# Patient Record
Sex: Male | Born: 1975 | Race: White | Hispanic: No | Marital: Married | Smoking: Never smoker
Health system: Southern US, Community
[De-identification: ages and names within clinical notes are randomized; demographics above are authoritative.]

## PROBLEM LIST (undated history)

## (undated) DIAGNOSIS — R7989 Other specified abnormal findings of blood chemistry: Secondary | ICD-10-CM

## (undated) DIAGNOSIS — K859 Acute pancreatitis without necrosis or infection, unspecified: Secondary | ICD-10-CM

## (undated) DIAGNOSIS — N320 Bladder-neck obstruction: Secondary | ICD-10-CM

## (undated) DIAGNOSIS — R319 Hematuria, unspecified: Secondary | ICD-10-CM

## (undated) HISTORY — PX: FEMUR FRACTURE SURGERY: SHX633

## (undated) HISTORY — PX: FRACTURE SURGERY: SHX138

## (undated) HISTORY — PX: NO PAST SURGERIES: SHX2092

---

## 2013-08-12 ENCOUNTER — Ambulatory Visit: Payer: Self-pay | Admitting: Emergency Medicine

## 2013-08-19 ENCOUNTER — Ambulatory Visit: Payer: Self-pay | Admitting: Family Medicine

## 2013-08-26 ENCOUNTER — Ambulatory Visit: Payer: Self-pay | Admitting: Emergency Medicine

## 2014-10-18 IMAGING — CR DG LUMBAR SPINE COMPLETE 4+V
1 series · 5 of 5 positions shown · non-contrast
Comparison: none

REASON FOR EXAM: Twisted back in near fall. Now with right radiculopathy
COMMENTS:

PROCEDURE:     MDR - M[REDACTED] SPINE WITH OBLIQUES  - August 12, 2013  [DATE]
RESULT:     Lumbar spine images show normal alignment. There is no fracture
or bony destruction. No severe degenerative changes are seen.

[Series 1: ap · 0.17mm/px · 5 of 5 slices shown]
[im 1/5]
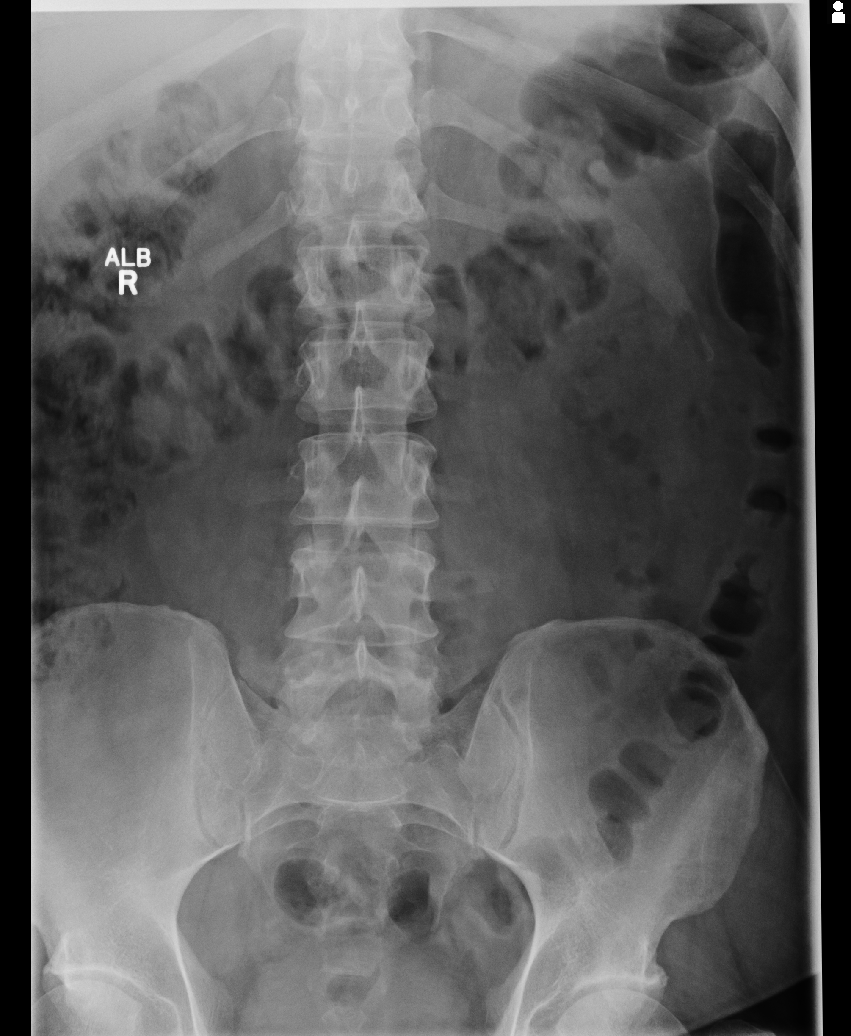
[im 2/5]
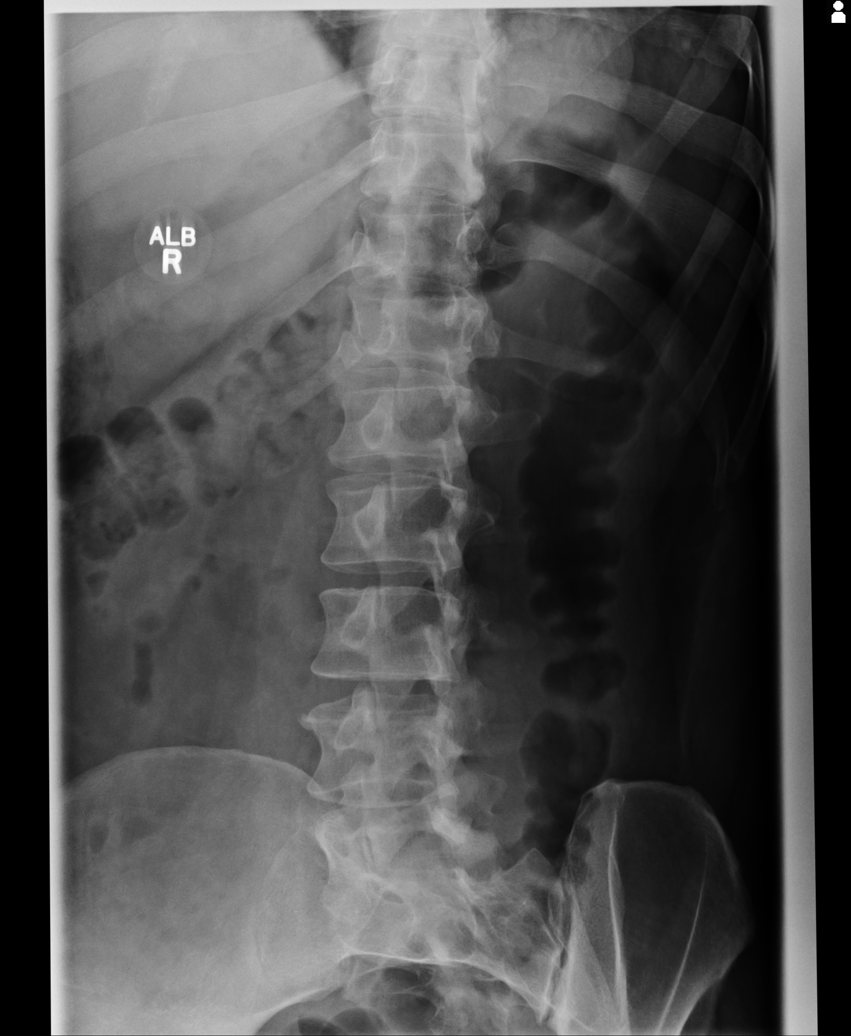
[im 3/5]
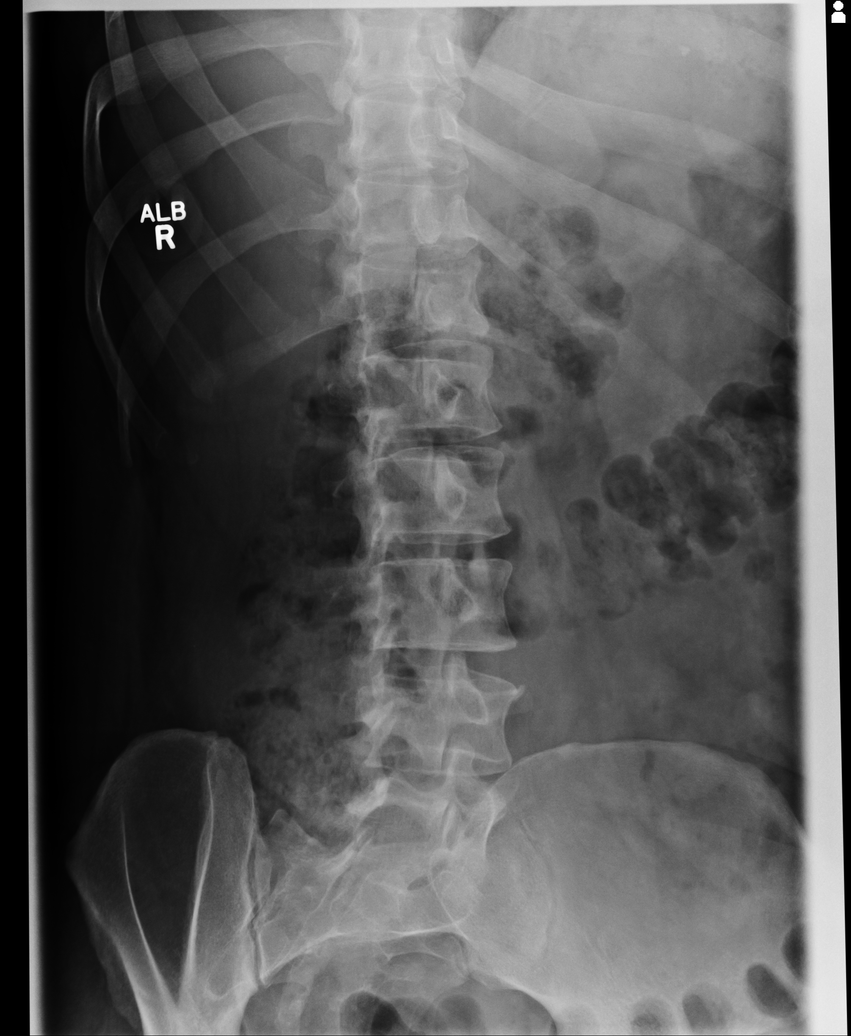
[im 4/5]
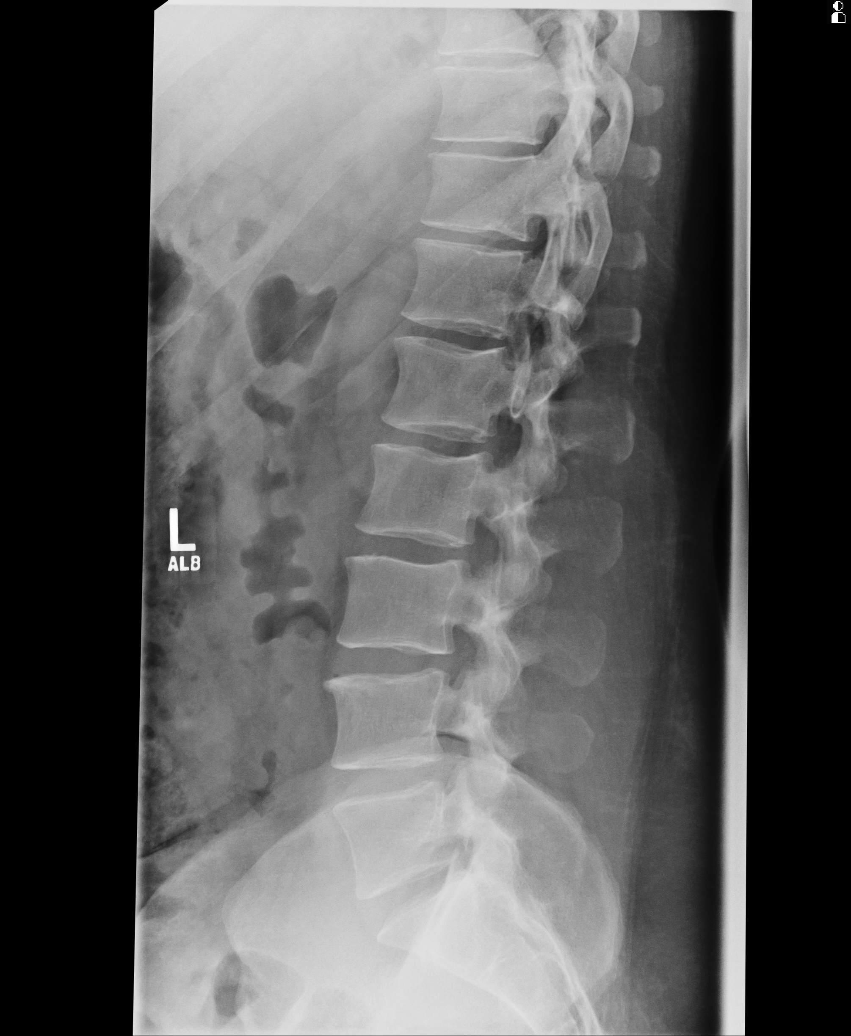
[im 5/5]
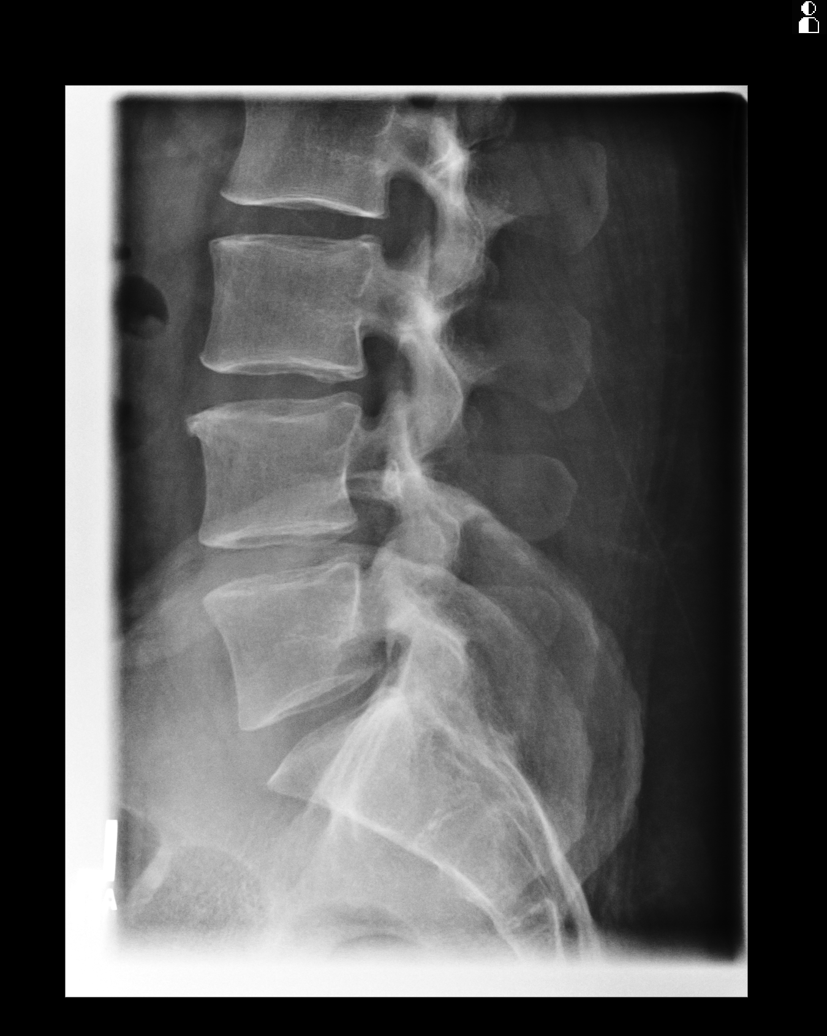

[5 of 5 positions shown; findings below may reference images not displayed]

IMPRESSION: No significant degenerative change or acute bony
abnormality.

[REDACTED]

## 2016-05-18 ENCOUNTER — Ambulatory Visit
Admission: EM | Admit: 2016-05-18 | Discharge: 2016-05-18 | Disposition: A | Discharge status: Home / Self Care | Payer: BLUE CROSS/BLUE SHIELD | Attending: Family Medicine | Admitting: Family Medicine

## 2016-05-18 DIAGNOSIS — N644 Mastodynia: Secondary | ICD-10-CM

## 2016-05-18 DIAGNOSIS — E291 Testicular hypofunction: Secondary | ICD-10-CM

## 2016-05-18 DIAGNOSIS — E349 Endocrine disorder, unspecified: Secondary | ICD-10-CM

## 2016-05-18 DIAGNOSIS — R066 Hiccough: Secondary | ICD-10-CM

## 2016-05-18 MED ORDER — CEFUROXIME AXETIL 500 MG PO TABS: 500.0000 mg | ORAL_TABLET | Freq: Two times a day (BID) | ORAL | Status: DC

## 2016-05-18 MED ORDER — METOCLOPRAMIDE HCL 10 MG PO TABS: 10.0000 mg | ORAL_TABLET | Freq: Three times a day (TID) | ORAL | Status: DC | PRN

## 2016-05-18 NOTE — ED Provider Notes (Addendum)
CSN: [id number]     Arrival date & time 05/18/16  1632 History   First MD Initiated Contact with Patient 05/18/16 1711    .Nurses notes were reviewed. Chief Complaint  Patient presents with  . Hiccups  . Breast Problem   Individuals here for multiple problems  #1 right nipple pain. Patient states that his right nipple is been sore and tender for about almost 2 months now. He cannot pinpoint trauma or injury to the right nipple states that his right nipple has been sore and tender. Now when anything touches his right nipple is very painful and causes some discomfort. There is no history of breast cancer or some breast lesions in the family. He's never had this problem before. He denies any type of discharge or other secretions from his nipples.    #2 hiccups. He states since Monday he's had this recurrent hiccups. He reports hearing something funny positions swallowing some water since then he's not been unable to shake this hiccups. States hiccups continue to recur.    #3 he reports as I was leaving the room another issue. He states that he's had slow flow for number of years he's always had trouble urinating for over 20 years. He states that the PCP he saw several years ago did a prostate exam after his employment physical which was not too thrilled about and felt that his prostate was somewhat enlarged. He wanted up getting a CT scan of his head after having of testosterone levels drawn and found his testosterone levels were extremely low. Palate they were worried about pituitary adenoma but later felt that the pituitary was satisfactory and was placed on testosterone. He is quite elaborate history of how in trying to pregnant his wife the last 2 children he had to go off the Flomax and the testosterone that he was placed on. At this point time he feels he needs to have further evaluation and to be revisited as far as the issues concerning. This issue is really a non-issue for the urgent care and  I strongly suggest that he see a PCP to have his testosterone levels drawn and have his pituitary gland evaluated.  No past pertinent medical history other than mentioned above. No previous surgeries mother had multiple strokes before she died. No history of breast cancer family. No known drug allergies and he never smoked   (Consider location/radiation/quality/duration/timing/severity/associated sxs/prior Treatment) HPI  History reviewed. No pertinent past medical history. Past Surgical History  Procedure Laterality Date  . No past surgeries     Family History  Problem Relation Age of Onset  . CVA Mother   . Hypertension Mother    Social History  Substance Use Topics  . Smoking status: Never Smoker   . Smokeless tobacco: None  . Alcohol Use: No    Review of Systems  Allergies  Review of patient's allergies indicates no known allergies.  Home Medications   Prior to Admission medications   Medication Sig Start Date End Date Taking? Authorizing Provider  cefUROXime (CEFTIN) 500 MG tablet Take 1 tablet (500 mg total) by mouth 2 (two) times daily. 05/18/16   Hassan RowanEugene Phuc Kluttz, MD  metoCLOPramide (REGLAN) 10 MG tablet Take 1 tablet (10 mg total) by mouth every 8 (eight) hours as needed (hiccups). 05/18/16   Hassan RowanEugene Triston Skare, MD   Meds Ordered and Administered this Visit  Medications - No data to display  BP 136/84 mmHg  Pulse 82  Temp(Src) 97.7 F (36.5 C) (Tympanic)  Resp  16  Ht 5\' 10"  (1.778 m)  Wt 270 lb (122.471 kg)  BMI 38.74 kg/m2  SpO2 99% No data found.   Physical Exam  Constitutional: He is oriented to person, place, and time. He appears well-developed and well-nourished.  Non-toxic appearance. He does not have a sickly appearance. He does not appear ill.  Obese white male  HENT:  Head: Normocephalic and atraumatic.  Right Ear: Hearing normal.  Left Ear: Hearing normal.  Nose: Nose normal.  Mouth/Throat: Oropharynx is clear and moist. He does not have dentures.  Normal dentition. No oropharyngeal exudate.  Eyes: Conjunctivae are normal. Pupils are equal, round, and reactive to light.  Neck: Normal range of motion.  Pulmonary/Chest: Effort normal and breath sounds normal. No respiratory distress. He has no wheezes.  There is tenderness over the right nipple no masses could be palpated no discharge could be elicited from either nipples. No changes between the 2 aereolas were noted  Musculoskeletal: Normal range of motion.  Neurological: He is alert and oriented to person, place, and time.  Skin: Skin is warm.  Psychiatric: He has a normal mood and affect.  Vitals reviewed.   ED Course  Procedures (including critical care time)  Labs Review Labs Reviewed - No data to display  Imaging Review No results found.   Visual Acuity Review  Right Eye Distance:   Left Eye Distance:   Bilateral Distance:    Right Eye Near:   Left Eye Near:    Bilateral Near:         MDM   1. Intractable hiccups   2. Nipple pain   3. Testosterone deficiency    #1 We will place him on for the intractable hiccups Reglan 10 mg 1 tablet 3 times a day enough for 10 days if this doesn't work he'll need to see possibly ENT. And have further evaluation.   #2 nipple pain. Stress to him we'll place him on Ceftin antibiotic cases of some infection mastitis the nipple but am recommending he follow up with a surgeon in 2 weeks I've been upfront with him and let him know them worried that they can get breast cancer and that he needs a surgeon to evaluate him and possibly order a mammogram if he has any tenderness whatsoever in his right nipple. This is something that should be followed up and not ignored. Will give them the phone number of the surgical group upstairs since the office is closed now for him to make an appointment.      #3 low testosterone level. As stated above I stressed to him this is not an urgent care issue that I can do with all would deal with. He  needs to establish himself with a PCP and be evaluated for history of low testosterone and difficulty he had an challenges he had an impregnating his partner.       Hassan RowanEugene Eloyce Bultman, MD 05/18/16 1811  Hassan RowanEugene Praneel Haisley, MD 05/22/16 2110

## 2016-05-18 NOTE — ED Notes (Addendum)
Patient complains of hiccups for 3 days. Patient states that he has been having them intermittently. Patient states that he is also having nipple pain that has been constant for 2 months, worse with certain movements. Patient states that his entire body feels like it is bruised from hiccupping so much.

## 2016-05-18 NOTE — Discharge Instructions (Signed)
Breast Tenderness Breast tenderness is a common problem for women of all ages. Breast tenderness may cause mild discomfort to severe pain. It has a variety of causes. Your health care provider will find out the likely cause of your breast tenderness by examining your breasts, asking you about symptoms, and ordering some tests. Breast tenderness usually does not mean you have breast cancer. HOME CARE INSTRUCTIONS  Breast tenderness often can be handled at home. You can try:  Getting fitted for a new bra that provides more support, especially during exercise.  Wearing a more supportive bra or sports bra while sleeping when your breasts are very tender.  If you have a breast injury, apply ice to the area:  Put ice in a plastic bag.  Place a towel between your skin and the bag.  Leave the ice on for 20 minutes, 2-3 times a day.  If your breasts are too full of milk as a result of breastfeeding, try:  Expressing milk either by hand or with a breast pump.  Applying a warm compress to the breasts for relief.  Taking over-the-counter pain relievers, if approved by your health care provider.  Taking other medicines that your health care provider prescribes. These may include antibiotic medicines or birth control pills. Over the long term, your breast tenderness might be eased if you:  Cut down on caffeine.  Reduce the amount of fat in your diet. Keep a log of the days and times when your breasts are most tender. This will help you and your health care provider find the cause of the tenderness and how to relieve it. Also, learn how to do breast exams at home. This will help you notice if you have an unusual growth or lump that could cause tenderness. SEEK MEDICAL CARE IF:   Any part of your breast is hard, red, and hot to the touch. This could be a sign of infection.  Fluid is coming out of your nipples (and you are not breastfeeding). Especially watch for blood or pus.  You have a fever  as well as breast tenderness.  You have a new or painful lump in your breast that remains after your menstrual period ends.  You have tried to take care of the pain at home, but it has not gone away.  Your breast pain is getting worse, or the pain is making it hard to do the things you usually do during your day.   This information is not intended to replace advice given to you by your health care provider. Make sure you discuss any questions you have with your health care provider.   Document Released: 10/19/2008 Document Revised: 07/09/2013 Document Reviewed: 06/05/2013 Elsevier Interactive Patient Education 2016 [employer]  Hiccups A hiccup is the result of a sudden shortening of the muscle below your lungs (diaphragm). This movement of your diaphragm causes a sudden inhalation followed by the closing of your vocal cords, which causes the hiccup sound. Most people get the hiccups. Typically, hiccups last only a short amount of time.  There are three types of hiccups:   Benign. These hiccups last less than 48 hours.   Persistent. These hiccups last more than 48 hours, but less than 1 month.   Intractable. These hiccups last more than 1 month.  A hiccup is a reflex. You cannot control reflexes.  HOME CARE INSTRUCTIONS  Watch your hiccups for any changes. The following actions may help to lessen any discomfort that you are feeling:  Eat small meals.   Limit alcohol intake to no more than 1 drink per day for nonpregnant women and 2 drinks per day for men. One drink equals 12 oz of beer, 5 oz of wine, or 1 oz of hard liquor.  Limit drinking carbonated or fizzy drinks, such as soda.  Eat and chew your food slowly.   Avoid eating or drinking hot or spicy foods and drinks.  Take medicines only as directed by your health care provider.  SEEK MEDICAL CARE IF:   Your hiccups last for more than 48 hours.   Your hiccups do not improve with treatment.  You cannot sleep or  eat due to the hiccups.   You have unexpected weight loss due to the hiccups.   You have a fever.   You have trouble breathing or swallowing.   You develop severe pain in your abdomen.  You develop numbness, tingling, or weakness.   This information is not intended to replace advice given to you by your health care provider. Make sure you discuss any questions you have with your health care provider.   Document Released: 01/15/2002 Document Revised: 03/23/2015 Document Reviewed: 11/02/2014 Elsevier Interactive Patient Education [employer]vier Inc.

## 2017-06-07 ENCOUNTER — Encounter: Payer: Self-pay | Admitting: Gynecology

## 2017-06-07 ENCOUNTER — Ambulatory Visit
Admission: EM | Admit: 2017-06-07 | Discharge: 2017-06-07 | Disposition: A | Discharge status: Home / Self Care | Payer: BLUE CROSS/BLUE SHIELD | Attending: Family Medicine | Admitting: Family Medicine

## 2017-06-07 DIAGNOSIS — R310 Gross hematuria: Secondary | ICD-10-CM

## 2017-06-07 DIAGNOSIS — N3001 Acute cystitis with hematuria: Secondary | ICD-10-CM | POA: Diagnosis not present

## 2017-06-07 LAB — URINALYSIS, COMPLETE (UACMP) WITH MICROSCOPIC
Glucose, UA: NEGATIVE mg/dL
Ketones, ur: 15 mg/dL — AB
Nitrite: NEGATIVE
PH: 5.5 (ref 5.0–8.0)
SQUAMOUS EPITHELIAL / LPF: NONE SEEN
Specific Gravity, Urine: >1.03 — ABNORMAL HIGH (ref 1.005–1.030)

## 2017-06-07 MED ORDER — PHENAZOPYRIDINE HCL 200 MG PO TABS: 200.0000 mg | ORAL_TABLET | Freq: Three times a day (TID) | ORAL | 0 refills | Status: DC | PRN

## 2017-06-07 MED ORDER — SULFAMETHOXAZOLE-TRIMETHOPRIM 800-160 MG PO TABS: 1.0000 | ORAL_TABLET | Freq: Two times a day (BID) | ORAL | 0 refills | Status: DC

## 2017-06-07 NOTE — ED Triage Notes (Signed)
Per pain painful urination x couple days and today notice blood in urine.

## 2017-06-07 NOTE — ED Provider Notes (Signed)
[facility]    CSN: [id number] Arrival date & time: 06/07/17  1933     History   Chief Complaint Chief Complaint  Patient presents with  . Hematuria    HPI Ryan Alvarado is a 41 y.o. male.   Patient is here for hematuria. He states he was having some burning on urination about a week ago. He asked his wife if needed could have or get UTIs. She said of course. He nor did the symptoms of a UTI until tonight where after work he took a shower and no strains of bloods coming from his penis when he urinated. He became so concerned and came in to be seen and evaluated. Denies any pain at this time. She basically is now painless hematuria. Interesting he had a history of prostate enlargement about 10 years ago he also states that time they placed him on a short course of testosterone treatment which she stopped on his own his own has prostate rechecked or testosterone levels rechecked either. No known drug allergies.he does not smoke. His mother that CVA and hypertension no previous surgeries before either.   The history is provided by the patient. No language interpreter was used.  Hematuria  This is a new problem. The problem occurs constantly. The problem has not changed since onset.Pertinent negatives include no chest pain, no abdominal pain, no headaches and no shortness of breath. Nothing aggravates the symptoms. Nothing relieves the symptoms. He has tried nothing for the symptoms.    History reviewed. No pertinent past medical history.  There are no active problems to display for this patient.   Past Surgical History:  Procedure Laterality Date  . NO PAST SURGERIES         Home Medications    Prior to Admission medications   Medication Sig Start Date End Date Taking? Authorizing Provider  cefUROXime (CEFTIN) 500 MG tablet Take 1 tablet (500 mg total) by mouth 2 (two) times daily. 05/18/16   Hassan Rowan, MD  metoCLOPramide (REGLAN) 10 MG tablet Take 1 tablet  (10 mg total) by mouth every 8 (eight) hours as needed (hiccups). 05/18/16   Hassan Rowan, MD  phenazopyridine (PYRIDIUM) 200 MG tablet Take 1 tablet (200 mg total) by mouth 3 (three) times daily as needed for pain. 06/07/17   Hassan Rowan, MD  sulfamethoxazole-trimethoprim (BACTRIM DS,SEPTRA DS) 800-160 MG tablet Take 1 tablet by mouth 2 (two) times daily. 06/07/17   Hassan Rowan, MD    Family History Family History  Problem Relation Age of Onset  . CVA Mother   . Hypertension Mother     Social History Social History  Substance Use Topics  . Smoking status: Never Smoker  . Smokeless tobacco: Never Used  . Alcohol use No     Allergies   Patient has no known allergies.   Review of Systems Review of Systems  Respiratory: Negative for shortness of breath.   Cardiovascular: Negative for chest pain.  Gastrointestinal: Negative for abdominal pain.  Genitourinary: Positive for frequency and hematuria. Negative for urgency.  Neurological: Negative for headaches.  All other systems reviewed and are negative.    Physical Exam Triage Vital Signs ED Triage Vitals  Enc Vitals Group     BP 06/07/17 2028 (!) 144/96     Pulse Rate 06/07/17 2028 77     Resp 06/07/17 2028 16     Temp 06/07/17 2028 97.9 F (36.6 C)     Temp Source 06/07/17 2028 Oral  SpO2 06/07/17 2028 100 %     Weight 06/07/17 2026 243 lb (110.2 kg)     Height 06/07/17 2026 5\' 9"  (1.753 m)     Head Circumference --      Peak Flow --      Pain Score 06/07/17 2027 3     Pain Loc --      Pain Edu? --      Excl. in GC? --    No data found.   Updated Vital Signs BP (!) 144/96 (BP Location: Left Arm)   Pulse 77   Temp 97.9 F (36.6 C) (Oral)   Resp 16   Ht 5\' 9"  (1.753 m)   Wt 243 lb (110.2 kg)   SpO2 100%   BMI 35.88 kg/m   Visual Acuity Right Eye Distance:   Left Eye Distance:   Bilateral Distance:    Right Eye Near:   Left Eye Near:    Bilateral Near:     Physical Exam  Constitutional: He  is oriented to person, place, and time. He appears well-developed.  HENT:  Head: Normocephalic and atraumatic.  Right Ear: External ear normal.  Left Ear: External ear normal.  Eyes: Pupils are equal, round, and reactive to light.  Neck: Normal range of motion. Neck supple.  Pulmonary/Chest: Breath sounds normal.  Abdominal: Soft. Bowel sounds are normal. He exhibits no distension. There is no tenderness.  Musculoskeletal: Normal range of motion. He exhibits no edema.  Neurological: He is alert and oriented to person, place, and time.  Skin: Skin is warm.  Psychiatric: He has a normal mood and affect.  Vitals reviewed.    UC Treatments / Results  Labs (all labs ordered are listed, but only abnormal results are displayed) Labs Reviewed  URINALYSIS, COMPLETE (UACMP) WITH MICROSCOPIC - Abnormal; Notable for the following:       Result Value   Color, Urine BROWN (*)    APPearance CLOUDY (*)    Specific Gravity, Urine >1.030 (*)    Hgb urine dipstick LARGE (*)    Bilirubin Urine SMALL (*)    Ketones, ur 15 (*)    Protein, ur >300 (*)    Leukocytes, UA SMALL (*)    Bacteria, UA MANY (*)    All other components within normal limits  URINE CULTURE    EKG  EKG Interpretation None       Radiology No results found.  Procedures Procedures (including critical care time)  Medications Ordered in UC Medications - No data to display  Results for orders placed or performed during the hospital encounter of 06/07/17  Urinalysis, Complete w Microscopic  Result Value Ref Range   Color, Urine BROWN (A) YELLOW   APPearance CLOUDY (A) CLEAR   Specific Gravity, Urine >1.030 (H) 1.005 - 1.030   pH 5.5 5.0 - 8.0   Glucose, UA NEGATIVE NEGATIVE mg/dL   Hgb urine dipstick LARGE (A) NEGATIVE   Bilirubin Urine SMALL (A) NEGATIVE   Ketones, ur 15 (A) NEGATIVE mg/dL   Protein, ur >161 (A) NEGATIVE mg/dL   Nitrite NEGATIVE NEGATIVE   Leukocytes, UA SMALL (A) NEGATIVE   Squamous  Epithelial / LPF NONE SEEN NONE SEEN   WBC, UA TOO NUMEROUS TO COUNT 0 - 5 WBC/hpf   RBC / HPF TOO NUMEROUS TO COUNT 0 - 5 RBC/hpf   Bacteria, UA MANY (A) NONE SEEN   Initial Impression / Assessment and Plan / UC Course  I have reviewed the triage vital signs  and the nursing notes.  Pertinent labs & imaging results that were available during my care of the patient were reviewed by me and considered in my medical decision making (see chart for details).     Due to patient's hematuria we will treat with Septra DS 1 tablet twice a day for 10 days Pyridium 200 mg 1 tablet 3 times a day for 5 days follow-up with PCP in about 2-3 weeks will need to show proof of cure resolution hematuria may need follow-up on his prostate and his testosterone. Work note given for tomorrow in case he does not feel wanted to go in.  Final Clinical Impressions(s) / UC Diagnoses   Final diagnoses:  Gross hematuria  Acute cystitis with hematuria    New Prescriptions Discharge Medication List as of 06/07/2017  9:29 PM    START taking these medications   Details  phenazopyridine (PYRIDIUM) 200 MG tablet Take 1 tablet (200 mg total) by mouth 3 (three) times daily as needed for pain., Starting Thu 06/07/2017, Normal    sulfamethoxazole-trimethoprim (BACTRIM DS,SEPTRA DS) 800-160 MG tablet Take 1 tablet by mouth 2 (two) times daily., Starting Thu 06/07/2017, Normal        Note: This dictation was prepared with Dragon dictation along with smaller phrase technology. Any transcriptional errors that result from this process are unintentional.   Hassan RowanWade, Jawanda Passey, MD 06/07/17 2145

## 2017-06-09 LAB — URINE CULTURE: SPECIAL REQUESTS: NORMAL

## 2018-09-13 ENCOUNTER — Other Ambulatory Visit: Payer: Self-pay

## 2018-09-13 ENCOUNTER — Ambulatory Visit
Admission: EM | Admit: 2018-09-13 | Discharge: 2018-09-13 | Disposition: A | Discharge status: Home / Self Care | Payer: BLUE CROSS/BLUE SHIELD | Attending: Family Medicine | Admitting: Family Medicine

## 2018-09-13 ENCOUNTER — Encounter: Payer: Self-pay | Admitting: Emergency Medicine

## 2018-09-13 DIAGNOSIS — N4889 Other specified disorders of penis: Secondary | ICD-10-CM | POA: Diagnosis not present

## 2018-09-13 LAB — URINALYSIS, COMPLETE (UACMP) WITH MICROSCOPIC
BILIRUBIN URINE: NEGATIVE
Glucose, UA: NEGATIVE mg/dL
Hgb urine dipstick: NEGATIVE
KETONES UR: NEGATIVE mg/dL
LEUKOCYTES UA: NEGATIVE
NITRITE: NEGATIVE
Protein, ur: NEGATIVE mg/dL
RBC / HPF: NONE SEEN RBC/hpf (ref 0–5)
Specific Gravity, Urine: 1.01 (ref 1.005–1.030)
pH: 7 (ref 5.0–8.0)

## 2018-09-13 NOTE — ED Provider Notes (Signed)
[facility]    CSN: [id number] Arrival date & time: 09/13/18  1225     History   Chief Complaint Chief Complaint  Patient presents with  . Dysuria    HPI Ryan Alvarado is a 42 y.o. male.   HPI  42 year old male presents with  burning with urination for the past 2 weeks.  Start having pain in his penis that started yesterday and when he awoke this morning noticed that he had swelling at the base of the penis with a "disappearance" of the dorsal vein.  Denies any trauma.  He states that his last sex was about 11/2 weeks ago with his wife.  He states that it was not rough sex at that time.  Denies any excessive masturbation. History of prostate  hyperplasia been treated with testosterone in the past.  Has not been using this for quite some time.  Complains of tenderness at the base of his penis when he pushes on it.  Episode of mucus from his penis prior to urination.  Was a one-time occurrence.        History reviewed. No pertinent past medical history.  There are no active problems to display for this patient.   Past Surgical History:  Procedure Laterality Date  . NO PAST SURGERIES         Home Medications    Prior to Admission medications   Medication Sig Start Date End Date Taking? Authorizing Provider  metoCLOPramide (REGLAN) 10 MG tablet Take 1 tablet (10 mg total) by mouth every 8 (eight) hours as needed (hiccups). 05/18/16   Hassan Rowan, MD    Family History Family History  Problem Relation Age of Onset  . CVA Mother   . Hypertension Mother     Social History Social History   Tobacco Use  . Smoking status: Never Smoker  . Smokeless tobacco: Never Used  Substance Use Topics  . Alcohol use: No    Alcohol/week: 0.0 standard drinks  . Drug use: No     Allergies   Patient has no known allergies.   Review of Systems Review of Systems  Constitutional: Negative for activity change, chills, fatigue and fever.  Genitourinary:  Positive for penile pain and penile swelling.  All other systems reviewed and are negative.    Physical Exam Triage Vital Signs ED Triage Vitals  Enc Vitals Group     BP 09/13/18 1249 139/87     Pulse Rate 09/13/18 1249 73     Resp 09/13/18 1249 16     Temp 09/13/18 1249 98.2 F (36.8 C)     Temp Source 09/13/18 1249 Oral     SpO2 09/13/18 1249 100 %     Weight 09/13/18 1241 238 lb (108 kg)     Height 09/13/18 1241 5\' 10"  (1.778 m)     Head Circumference --      Peak Flow --      Pain Score 09/13/18 1241 0     Pain Loc --      Pain Edu? --      Excl. in GC? --    No data found.  Updated Vital Signs BP 139/87 (BP Location: Left Arm)   Pulse 73   Temp 98.2 F (36.8 C) (Oral)   Resp 16   Ht 5\' 10"  (1.778 m)   Wt 238 lb (108 kg)   SpO2 100%   BMI 34.15 kg/m   Visual Acuity Right Eye Distance:   Left Eye Distance:  Bilateral Distance:    Right Eye Near:   Left Eye Near:    Bilateral Near:     Physical Exam  Constitutional: He is oriented to person, place, and time. He appears well-developed and well-nourished. No distress.  HENT:  Head: Normocephalic.  Eyes: Pupils are equal, round, and reactive to light. Right eye exhibits no discharge. Left eye exhibits no discharge.  Neck: Normal range of motion.  Genitourinary: Penile tenderness present.  Genitourinary Comments: Examination of the penis shows a normal male phallus.  He is complaining of swelling over the skin of the shaft of the penis on the right side dorsum.  Is puffy compared to the rest of the penis.  No erythema no induration no fluctuance.  He does have superficial varicosities of the scrotum.  Testicles are not tender or swollen.  Prostate exam was deferred to urology which the patient will be seeing next week hopefully  Musculoskeletal: Normal range of motion.  Neurological: He is alert and oriented to person, place, and time.  Skin: Skin is warm and dry. He is not diaphoretic.  Psychiatric: He has a  normal mood and affect. His behavior is normal. Judgment and thought content normal.  Nursing note and vitals reviewed.    UC Treatments / Results  Labs (all labs ordered are listed, but only abnormal results are displayed) Labs Reviewed  URINALYSIS, COMPLETE (UACMP) WITH MICROSCOPIC - Abnormal; Notable for the following components:      Result Value   Bacteria, UA RARE (*)    All other components within normal limits    EKG None  Radiology No results found.  Procedures Procedures (including critical care time)  Medications Ordered in UC Medications - No data to display  Initial Impression / Assessment and Plan / UC Course  I have reviewed the triage vital signs and the nursing notes.  Pertinent labs & imaging results that were available during my care of the patient were reviewed by me and considered in my medical decision making (see chart for details).     Reassured the patient that I do not believe that this is anything dire.  However think he should be seen by urology next week.  And address the superficial varicosities as well as examined the swelling of the penis that is still present.  Because of his previous prostatic hyperplasia I feel that following on a more frequent basis with urology would be prudent.  He is agreeable. Final Clinical Impressions(s) / UC Diagnoses   Final diagnoses:  Swelling of penis     Discharge Instructions     Avoid pressure.  Follow-up with [facility] urological next week.    ED Prescriptions    None     Controlled Substance Prescriptions  Controlled Substance Registry consulted? Not Applicable   Lutricia Feil, PA-C 09/13/18 1614

## 2018-09-13 NOTE — ED Triage Notes (Signed)
Patient c/o burning when urinating for the past 2 weeks.  Patient started having some pain in his penis that started today.

## 2018-09-13 NOTE — Discharge Instructions (Signed)
Avoid pressure.  Follow-up with [facility] urological next week.

## 2018-09-16 ENCOUNTER — Encounter: Payer: Self-pay | Admitting: Urology

## 2018-09-16 ENCOUNTER — Ambulatory Visit: Payer: BLUE CROSS/BLUE SHIELD | Admitting: Urology

## 2018-09-16 VITALS — BP 131/86 | HR 91 | Ht 70.0 in | Wt 237.1 lb

## 2018-09-16 DIAGNOSIS — N4889 Other specified disorders of penis: Secondary | ICD-10-CM | POA: Diagnosis not present

## 2018-09-16 NOTE — Progress Notes (Signed)
09/16/2018 3:27 PM   Ryan Alvarado 08-01-1976 [id number]  Referring provider: No referring provider defined for this encounter.  Chief Complaint  Patient presents with  . Penis Pain    HPI: Patient is 42 year old Caucasian male referred by [facility] urgent care for penile swelling.  He presented to [facility] urgent care with the complain of dysuria for two weeks that progressed to swelling at the base of his penis with a disappearance of his dorsal vein.  This occurred on Friday.  UA was bland.   He denied having any vigorous sexual activity prior to the incident, he denied having an erection prior to the incident and he denied any popping sound prior to the incident.  He states last year he had the onset of dysuria and gross hematuria associated with nausea vomiting and diarrhea.  He was placed on an antibiotic and urine culture was performed.  He states he was told the urine culture was contaminated, but his symptoms abated once he completed the antibiotic and did not return.  He also states that approximately 10 years ago he been having urinary symptoms consisting of urgency, dysuria, nocturia, intermittency, hesitancy, straining to urinate and a weak urinary stream.  He states he saw someone in [place] and was instructed to void behind the wall and then was told he urinated like an 42 year old.  He was put on some medication that interfered with ejaculations, so I am assuming it was an alpha-blocker.  He states he stopped the medication once he had difficulty with the ejaculations.  He also stated that he has been on testosterone therapy in the past.  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      PMH: No past medical history on file.  Surgical History: Past Surgical History:  Procedure Laterality Date  . NO PAST SURGERIES      Home Medications:  Allergies as of 09/16/2018   No Known Allergies     Medication List    as of 09/16/2018  3:27 PM   You have not been prescribed any medications.     Allergies: No Known Allergies  Family History: Family History  Problem Relation Age of Onset  . CVA Mother   . Hypertension Mother     Social History:  reports that he has never smoked. He has never used smokeless tobacco. He reports that he does not drink alcohol or use drugs.  ROS: UROLOGY Frequent Urination?: No Hard to postpone urination?: Yes Burning/pain with urination?: Yes Get up at night to urinate?: Yes Leakage of urine?: No Urine stream starts and stops?: Yes Trouble starting stream?: Yes Do you have to strain to urinate?: Yes Blood in urine?: No Urinary tract infection?: No Sexually transmitted disease?: No Injury to kidneys or bladder?: No Painful intercourse?: No Weak stream?: Yes Erection problems?: Yes Penile pain?: Yes  Gastrointestinal Nausea?: No Vomiting?: No Indigestion/heartburn?: No Diarrhea?: No Constipation?: No  Constitutional Fever: No Night sweats?: No Weight loss?: No Fatigue?: No  Skin Skin rash/lesions?: No Itching?: No  Eyes Blurred vision?: No Double vision?: No  Ears/Nose/Throat Sore throat?: No Sinus problems?: No  Hematologic/Lymphatic Swollen glands?: No Easy bruising?: No  Cardiovascular Leg swelling?: No Chest pain?: No  Respiratory Cough?: No Shortness of breath?: No  Endocrine Excessive thirst?: No  Musculoskeletal Back pain?: No Joint pain?: No  Neurological Headaches?: No Dizziness?: No  Psychologic Depression?: No Anxiety?:  No  Physical Exam: BP 131/86 (BP Location: Left Arm, Patient Position: Sitting, Cuff Size: Large)   Pulse 91   Ht 5\' 10"  (1.778 m)   Wt 237 lb 1.6 oz (107.5 kg)   BMI 34.02 kg/m   Constitutional:  Well nourished. Alert and oriented, No acute distress. HEENT: [place] AT, moist mucus membranes.  Trachea midline, no masses. Cardiovascular: No clubbing, cyanosis,  or edema. Respiratory: Normal respiratory effort, no increased work of breathing. GI: Abdomen is soft, non tender, non distended, no abdominal masses. Liver and spleen not palpable.  No hernias appreciated.  Stool sample for occult testing is not indicated.   GU: No CVA tenderness.  No bladder fullness or masses.  Patient with circumcised phallus.  Cord like indurated plaque located on the right dorsal surface of the penis.  Separate from the corporal tissue.  Urethral meatus is patent.  No penile discharge. No penile lesions or rashes. Scrotum without lesions, cysts, rashes and/or edema.  Testicles are located scrotally bilaterally. No masses are appreciated in the testicles. Left and right epididymis are normal. Rectal: Patient with  normal sphincter tone. Anus and perineum without scarring or rashes. No rectal masses are appreciated. Prostate is approximately 45 grams, no nodules are appreciated. Seminal vesicles are normal. Skin: No rashes, bruises or suspicious lesions. Lymph: No cervical or inguinal adenopathy. Neurologic: Grossly intact, no focal deficits, moving all 4 extremities. Psychiatric: Normal mood and affect.  Laboratory Data: No results found for: WBC, HGB, HCT, MCV, PLT  No results found for: CREATININE  No results found for: PSA  No results found for: TESTOSTERONE  No results found for: HGBA1C  No results found for: TSH  No results found for: CHOL, HDL, CHOLHDL, VLDL, LDLCALC  No results found for: AST No results found for: ALT No components found for: ALKALINEPHOPHATASE No components found for: BILIRUBINTOTAL  No results found for: ESTRADIOL  Urinalysis    Component Value Date/Time   COLORURINE YELLOW 09/13/2018 1243   APPEARANCEUR CLEAR 09/13/2018 1243   LABSPEC 1.010 09/13/2018 1243   PHURINE 7.0 09/13/2018 1243   GLUCOSEU NEGATIVE 09/13/2018 1243   HGBUR NEGATIVE 09/13/2018 1243   BILIRUBINUR NEGATIVE 09/13/2018 1243   KETONESUR NEGATIVE 09/13/2018  1243   PROTEINUR NEGATIVE 09/13/2018 1243   NITRITE NEGATIVE 09/13/2018 1243   LEUKOCYTESUR NEGATIVE 09/13/2018 1243    I have reviewed the labs.   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 History of hypogonadism Patient will return for morning testosterone level  3. BPH with LUTS Continue conservative management, avoiding bladder irritants and timed voiding's PSA drawn today  Return in about 2 weeks (around 09/30/2018) for recheck .  These notes generated with voice recognition software. I apologize for typographical errors.  Cloretta Ned  [facility] [address]  [address] [place], [place] [address] [phone]  Attempted to retrieve previous records but it had been ten years since he was seen and records were destroyed

## 2018-09-17 ENCOUNTER — Other Ambulatory Visit: Payer: Self-pay

## 2018-09-25 ENCOUNTER — Other Ambulatory Visit: Payer: Self-pay | Admitting: Family Medicine

## 2018-09-25 DIAGNOSIS — N4889 Other specified disorders of penis: Secondary | ICD-10-CM

## 2018-09-26 ENCOUNTER — Other Ambulatory Visit: Payer: BLUE CROSS/BLUE SHIELD

## 2018-09-26 DIAGNOSIS — N4889 Other specified disorders of penis: Secondary | ICD-10-CM

## 2018-09-27 LAB — TESTOSTERONE: Testosterone: 184 ng/dL — ABNORMAL LOW (ref 264–916)

## 2018-10-01 ENCOUNTER — Telehealth: Payer: Self-pay

## 2018-10-01 NOTE — Telephone Encounter (Signed)
Called pt informed him of the information below. Pt gave verbal understanding.  

## 2018-10-01 NOTE — Telephone Encounter (Signed)
-----   Message from Harle BattiestShannon A McGowan, PA-C sent at 09/30/2018  8:28 AM EST ----- Please let Ryan Alvarado know that his testosterone was low.  We need to repeat it in the morning before 10 am.  He has an upcoming appointment it the am and we can repeat it then.

## 2018-10-02 NOTE — Progress Notes (Signed)
October 03, 2018  10:03 AM   Ryan Alvarado 1976/06/05 [id number]  Referring provider: No referring provider defined for this encounter.  Chief Complaint  Patient presents with  . Follow-up    2 weeks    HPI: Patient is a 42 year old Caucasian male who returns today for recheck on his penile pain, urinary issues and the evaluation and management of hypogonadism.   [facility]. [facility] complaining of dysuria for two weeks that progressed to swelling at the base of his penis with a disappearance of his dorsal vein.  This occurred on 09/13/2018. UA was bland. He denied having any vigorous sexual activity, erection, and any popping sound prior to the incident. He states last year he had the onset of dysuria and gross hematuria associated with nausea, vomiting, and diarrhea.  He was placed on an antibiotic and urine culture was performed.  He states he was told the urine culture was contaminated, but his symptoms abated once he completed the antibiotic and did not return. He also states that approximately 10 years ago he been having urinary symptoms consisting of urgency, dysuria, nocturia, intermittency, hesitancy, straining to urinate and a weak urinary stream.  He states he saw someone in [place] and was instructed to void behind the wall and then was told he urinated like an 42 year old.  He was put on some medication that interfered with ejaculations, so I assumed it was an alpha-blocker.  He stated stopping the medication once he noticed any difficulties with ejaculations. He also stated that he has been on testosterone therapy in the past.  He stated his testosterone was found at 168 and an MRI of the pituitary gland demonstrated that he had a small pituitary gland.    Pt reports symptoms of burning/painful urination, night time urination, urine stream that starts and stops, weak stream, penile pain and aching  pain when walking. Pt describes the burning sensation as "razor." Pt reports swelling on the penis head. Pt denies sexual activity, masterbating and reports a "puffy" spot on the left side of the penis head that "looks like a vein." The swelling appeared on the right side after and looks "puffy". He reports that aspirin was not helping. Swelling worsens throughout the day. Pt denies Hx of kidney stone and blood disorders. Pt reports itching and if not, then pain.   PMH: No past medical history on file.  Surgical History: Past Surgical History:  Procedure Laterality Date  . NO PAST SURGERIES      Home Medications:  Allergies as of 10/03/2018   No Known Allergies     Medication List    as of 10/03/2018 10:03 AM   You have not been prescribed any medications.     Allergies: No Known Allergies  Family History: Family History  Problem Relation Age of Onset  . CVA Mother   . Hypertension Mother     Social History:  reports that he has never smoked. He has never used smokeless tobacco. He reports that he does not drink alcohol or use drugs.  ROS: UROLOGY Frequent Urination?: No Hard to postpone urination?: No Burning/pain with urination?: Yes Get up at night to urinate?: Yes Leakage of urine?: No Urine stream starts and stops?: Yes Trouble starting stream?: No Do you have to strain to urinate?: No Blood in urine?: No Urinary tract infection?: No Sexually transmitted disease?: No Injury to kidneys or bladder?: No  Painful intercourse?: No Weak stream?: Yes Erection problems?: No Penile pain?: Yes  Gastrointestinal Nausea?: No Vomiting?: No Indigestion/heartburn?: No Diarrhea?: No Constipation?: No  Constitutional Fever: No Night sweats?: No Weight loss?: No Fatigue?: No  Skin Skin rash/lesions?: No Itching?: No  Eyes Blurred vision?: No Double vision?: No  Ears/Nose/Throat Sinus problems?: No  Hematologic/Lymphatic Swollen glands?: No Easy  bruising?: No  Cardiovascular Leg swelling?: No Chest pain?: No  Respiratory Cough?: No Shortness of breath?: No  Endocrine Excessive thirst?: No  Musculoskeletal Back pain?: No Joint pain?: No  Neurological Headaches?: No Dizziness?: No  Psychologic Depression?: No Anxiety?: No  Physical Exam: BP (!) 146/78 (BP Location: Right Arm, Patient Position: Sitting, Cuff Size: Large)   Pulse 79   Ht 5\' 10"  (1.778 m)   Wt 239 lb 4.8 oz (108.5 kg)   BMI 34.34 kg/m   Constitutional: Well nourished. Alert and oriented, No acute distress. HEENT: [place] AT, moist mucus membranes. Trachea midline, no masses. Cardiovascular: No clubbing, cyanosis, or edema. Respiratory: Normal respiratory effort, no increased work of breathing. GI: Abdomen is soft, non tender, non distended, no abdominal masses. Liver and spleen not palpable.  No hernias appreciated.  Stool sample for occult testing is not indicated.   GU: No CVA tenderness.  No bladder fullness or masses.  Patient with circumcised phallus.Urethral meatus is patent. Cord like indurated plaque located on the right dorsal surface of the penis improved from last exam.  Separate from the corporal tissue. Urethral meatus is patent. No penile discharge. No penile lesions or rashes. Scrotum without lesions, cysts, rashes and/or edema.  Testicles are located scrotally bilaterally. No masses are appreciated in the testicles. Left and right epididymis are normal.  Linear induration on the left coronal ridge dorsally. Skin: No rashes, bruises or suspicious lesions. Lymph: No cervical or inguinal adenopathy. Neurologic: Grossly intact, no focal deficits, moving all 4 extremities. Psychiatric: Normal mood and affect.    Laboratory Data: No results found for: WBC, HGB, HCT, MCV, PLT  No results found for: CREATININE  No results found for: PSA  Lab Results  Component Value Date   TESTOSTERONE 184 (L) 09/26/2018    No results found for:  HGBA1C  No results found for: TSH  No results found for: CHOL, HDL, CHOLHDL, VLDL, LDLCALC  No results found for: AST No results found for: ALT No components found for: ALKALINEPHOPHATASE No components found for: BILIRUBINTOTAL  No results found for: ESTRADIOL  Urinalysis Bacteria.  See Epic. I have reviewed the labs.   Assessment & Plan:    1. Mondor's Disease of penis Improved  2. History of hypogonadism Pt first morning testosterone level was found to be abnormal  Second morning drawn today  Follow-up pending blood work   3. BPH with LUTS Continue conservative management, avoiding bladder irritants and timed voiding's PSA drawn today  4. Dysuria UA was moderate bacteria today, will send for culture Red flag signs reviewed such as if pain worsens or stoppage of urination or erythema of the penis Will start on Bactrim DS empirically will adjust when urine culture and sensitivtiy, if needed  Schedule cystoscopy to rule out urethral strictures  I have explained to the patient that they will  be scheduled for a cystoscopy in our office to evaluate their bladder.  The cystoscopy consists of passing a tube with a lens up through their urethra and into their urinary bladder.   We will inject the urethra with a lidocaine gel prior to introducing the  cystoscope to help with any discomfort during the procedure.   After the procedure, they might experience blood in the urine and discomfort with urination.  This will abate after the first few voids.  I have  encouraged the patient to increase water intake  during this time.  Patient denies any allergies to lidocaine.   5. Occluded sebaceous cysts in the coronal ridge Not infected at this time Will continue to monitor   Return for Cysto.  These notes generated with voice recognition software. I apologize for typographical errors.  Michiel Cowboy, PA-C  [facility] [address]  [address] [place], [place] [address] [phone]  I, Donne Hazel, am acting as a Neurosurgeon for [employer],  I have reviewed the above documentation for accuracy and completeness, and I agree with the above.    Michiel Cowboy, PA-C

## 2018-10-03 ENCOUNTER — Ambulatory Visit: Payer: BLUE CROSS/BLUE SHIELD | Admitting: Urology

## 2018-10-03 ENCOUNTER — Encounter: Payer: Self-pay | Admitting: Urology

## 2018-10-03 VITALS — BP 146/78 | HR 79 | Ht 70.0 in | Wt 239.3 lb

## 2018-10-03 DIAGNOSIS — N138 Other obstructive and reflux uropathy: Secondary | ICD-10-CM

## 2018-10-03 DIAGNOSIS — N401 Enlarged prostate with lower urinary tract symptoms: Secondary | ICD-10-CM | POA: Diagnosis not present

## 2018-10-03 DIAGNOSIS — E291 Testicular hypofunction: Secondary | ICD-10-CM | POA: Diagnosis not present

## 2018-10-03 DIAGNOSIS — R300 Dysuria: Secondary | ICD-10-CM

## 2018-10-03 DIAGNOSIS — N4889 Other specified disorders of penis: Secondary | ICD-10-CM

## 2018-10-03 DIAGNOSIS — Z872 Personal history of diseases of the skin and subcutaneous tissue: Secondary | ICD-10-CM

## 2018-10-03 LAB — MICROSCOPIC EXAMINATION: RBC MICROSCOPIC, UA: NONE SEEN /HPF (ref 0–2)

## 2018-10-03 LAB — URINALYSIS, COMPLETE
Bilirubin, UA: NEGATIVE
Glucose, UA: NEGATIVE
Ketones, UA: NEGATIVE
Leukocytes, UA: NEGATIVE
Nitrite, UA: NEGATIVE
PH UA: 7 (ref 5.0–7.5)
Protein, UA: NEGATIVE
RBC, UA: NEGATIVE
Specific Gravity, UA: 1.02 (ref 1.005–1.030)
UUROB: 0.2 mg/dL (ref 0.2–1.0)

## 2018-10-03 MED ORDER — SULFAMETHOXAZOLE-TRIMETHOPRIM 800-160 MG PO TABS: 1.0000 | ORAL_TABLET | Freq: Two times a day (BID) | ORAL | 0 refills | Status: DC

## 2018-10-03 NOTE — Addendum Note (Signed)
Addended by: Michiel CowboyMCGOWAN, Mirely Pangle A on: 10/03/2018 10:44 AM   Modules accepted: Orders

## 2018-10-04 ENCOUNTER — Telehealth: Payer: Self-pay

## 2018-10-04 LAB — PSA: PROSTATE SPECIFIC AG, SERUM: 0.7 ng/mL (ref 0.0–4.0)

## 2018-10-04 LAB — TESTOSTERONE: Testosterone: 177 ng/dL — ABNORMAL LOW (ref 264–916)

## 2018-10-04 NOTE — Telephone Encounter (Signed)
Spoke with [facility] and requested LH/FSH add on.

## 2018-10-07 ENCOUNTER — Telehealth: Payer: Self-pay

## 2018-10-07 NOTE — Telephone Encounter (Signed)
-----  Message from Nori Riis, PA-C sent at 10/07/2018 10:48 AM EST ----- Please let Ryan Alvarado know that he has met criteria for testosterone deficiency.  I would wait until after the cystoscopy to start therapy.

## 2018-10-08 ENCOUNTER — Telehealth: Payer: Self-pay

## 2018-10-08 DIAGNOSIS — N3001 Acute cystitis with hematuria: Secondary | ICD-10-CM | POA: Insufficient documentation

## 2018-10-08 DIAGNOSIS — N390 Urinary tract infection, site not specified: Secondary | ICD-10-CM | POA: Insufficient documentation

## 2018-10-08 NOTE — Telephone Encounter (Signed)
Left pt a mess to call in regards to late night nurse call about dysuria. Patient did not follow up at ER, left message to see if he could come to the office today for a UA and culture nurse visit

## 2018-10-14 LAB — SPECIMEN STATUS REPORT

## 2018-10-14 LAB — FSH/LH
FSH: 3.1 m[IU]/mL (ref 1.5–12.4)
LH: 3.3 m[IU]/mL (ref 1.7–8.6)

## 2018-10-22 ENCOUNTER — Other Ambulatory Visit: Payer: Self-pay

## 2018-10-22 ENCOUNTER — Encounter: Payer: Self-pay | Admitting: Urology

## 2018-10-22 ENCOUNTER — Ambulatory Visit (INDEPENDENT_AMBULATORY_CARE_PROVIDER_SITE_OTHER): Payer: BLUE CROSS/BLUE SHIELD | Admitting: Urology

## 2018-10-22 VITALS — BP 143/88 | HR 96 | Wt 240.0 lb

## 2018-10-22 DIAGNOSIS — N138 Other obstructive and reflux uropathy: Secondary | ICD-10-CM

## 2018-10-22 DIAGNOSIS — R300 Dysuria: Secondary | ICD-10-CM | POA: Diagnosis not present

## 2018-10-22 DIAGNOSIS — N401 Enlarged prostate with lower urinary tract symptoms: Secondary | ICD-10-CM

## 2018-10-22 LAB — MICROSCOPIC EXAMINATION
Bacteria, UA: NONE SEEN
Epithelial Cells (non renal): NONE SEEN /hpf (ref 0–10)
WBC, UA: NONE SEEN /hpf (ref 0–5)

## 2018-10-22 LAB — URINALYSIS, COMPLETE
BILIRUBIN UA: NEGATIVE
GLUCOSE, UA: NEGATIVE
KETONES UA: NEGATIVE
Leukocytes, UA: NEGATIVE
Nitrite, UA: NEGATIVE
Protein, UA: NEGATIVE
Specific Gravity, UA: 1.02 (ref 1.005–1.030)
UUROB: 0.2 mg/dL (ref 0.2–1.0)
pH, UA: 6 (ref 5.0–7.5)

## 2018-10-22 NOTE — Progress Notes (Signed)
   10/22/2018 5:17 PM   Ryan Alvarado 05/31/1976 [id number]  Referring provider: No referring provider defined for this encounter.  Chief Complaint  Patient presents with  . 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  He is scheduled for direct vision internal urethrotomy.   PMH: No past medical history on file.  Surgical History: Past Surgical History:  Procedure Laterality Date  . NO PAST SURGERIES      Home Medications:  Allergies as of 10/22/2018      Reactions   Sulfamethoxazole-trimethoprim Other (See Comments)   Pancreatitis      Medication List    as of 10/22/2018  5:17 PM   You have not been prescribed any medications.     Allergies:  Allergies  Allergen Reactions  . Sulfamethoxazole-Trimethoprim Other (See Comments)    Pancreatitis    Family History: Family History  Problem Relation Age of Onset  . CVA Mother   . Hypertension Mother     Social History:  reports that he has never smoked. He has never used smokeless tobacco. He reports that he does not drink alcohol or use drugs.  ROS: No significant change from 09/16/2018  Physical Exam: BP (!) 143/88   Pulse 96   Wt 240 lb (108.9 kg)   BMI 34.44 kg/m   Constitutional:  Alert and oriented, No acute distress. HEENT: [place] AT, moist mucus membranes.  Trachea midline, no masses. Cardiovascular: No clubbing, cyanosis, or edema.  RRR Respiratory: Normal respiratory effort, no increased work of breathing.  Lungs clear GI: Abdomen is soft, nontender, nondistended, no abdominal masses GU: No CVA tenderness Lymph: No cervical or inguinal lymphadenopathy. Skin: No rashes, bruises or suspicious lesions. Neurologic: Grossly intact, no focal deficits, moving all 4 extremities. Psychiatric: Normal mood and affect.   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  The procedure has been discussed in detail as per my cystoscopy note.  All questions were answered and he desires to proceed.   Riki AltesScott C Bay Jarquin, MD  [facility] [address], [address] [place], [place] [id number] [phone]

## 2018-10-22 NOTE — Progress Notes (Signed)
   10/22/18  CC:  Chief Complaint  Patient presents with  . Procedure    Cystoscopy    HPI: Refer to Partridge Househannon McGowan's note of 10/03/2018.  Long history of voiding symptoms including decreased force and caliber of his urinary stream and dysuria.  Blood pressure (!) 143/88, pulse 96, weight 240 lb (108.9 kg). NED. A&Ox3.   No respiratory distress   Abd soft, NT, ND Normal phallus with bilateral descended testicles  Cystoscopy Procedure Note  Patient identification was confirmed, informed consent was obtained, and patient was prepped using Betadine solution.  Lidocaine jelly was administered per urethral meatus.     Pre-Procedure: - Inspection reveals a normal caliber ureteral meatus.  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  Findings were discussed and I recommended scheduling internal urethrotomy.  The procedure was discussed in detail.  The alternative of urethral dilation was discussed. The relatively high recurrence rates with both dilation and internal urethrotomy were reviewed along with the need of close monitoring.   Riki AltesScott C Stoioff, MD

## 2018-10-22 NOTE — H&P (View-Only) (Signed)
   10/22/2018 5:17 PM   Ryan Alvarado 11/12/1976 [id number]  Referring provider: No referring provider defined for this encounter.  Chief Complaint  Patient presents with  . 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  Cystoscopy performed today shows a <6 French bulb stricture.  He is scheduled for direct vision internal urethrotomy.   PMH: No past medical history on file.  Surgical History: Past Surgical History:  Procedure Laterality Date  . NO PAST SURGERIES      Home Medications:  Allergies as of 10/22/2018      Reactions   Sulfamethoxazole-trimethoprim Other (See Comments)   Pancreatitis      Medication List    as of 10/22/2018  5:17 PM   You have not been prescribed any medications.     Allergies:  Allergies  Allergen Reactions  . Sulfamethoxazole-Trimethoprim Other (See Comments)    Pancreatitis    Family History: Family History  Problem Relation Age of Onset  . CVA Mother   . Hypertension Mother     Social History:  reports that he has never smoked. He has never used smokeless tobacco. He reports that he does not drink alcohol or use drugs.  ROS: No significant change from 09/16/2018  Physical Exam: BP (!) 143/88   Pulse 96   Wt 240 lb (108.9 kg)   BMI 34.44 kg/m   Constitutional:  Alert and oriented, No acute distress. HEENT: [place] AT, moist mucus membranes.  Trachea midline, no masses. Cardiovascular: No clubbing, cyanosis, or edema.  RRR Respiratory: Normal respiratory effort, no increased work of breathing.  Lungs clear GI: Abdomen is soft, nontender, nondistended, no abdominal masses GU: No CVA tenderness Lymph: No cervical or inguinal lymphadenopathy. Skin: No rashes, bruises or suspicious lesions. Neurologic: Grossly intact, no focal deficits, moving all 4 extremities. Psychiatric: Normal mood and affect.   Assessment & Plan:   42-year-old male with a small caliber  bulb stricture for internal urethrotomy.  The procedure has been discussed in detail as per my cystoscopy note.  All questions were answered and he desires to proceed.   Meziah Blasingame C Cleotis Sparr, MD  [facility] [address], [address] [place],  [address] [phone]  

## 2018-10-24 ENCOUNTER — Other Ambulatory Visit: Payer: Self-pay | Admitting: Radiology

## 2018-10-24 ENCOUNTER — Encounter: Payer: Self-pay | Admitting: Radiology

## 2018-10-24 DIAGNOSIS — N35912 Unspecified bulbous urethral stricture, male: Secondary | ICD-10-CM

## 2018-10-24 NOTE — Telephone Encounter (Signed)
discussed the [facility] Surgery Information form below with patient over the phone & also sent instructions via MyChart.   [address] [facility], [address] [place], [place] [address] Telephone: [phone] Fax: [phone]   Thank you for choosing [facility] for your upcoming surgery!  We are always here to assist in your urological needs.  Please read the following information with specific details for your upcoming appointments related to your surgery. Please contact Shontell Prosser at [phone] Option 3 with any questions.  The Name of Your Surgery: Direct vision internal urethrotomy Your Surgery Date: 11/05/2018 Your Surgeon: Irineo AxonScott Stoioff  Please call Same Day Surgery at [phone] between the hours of 1pm-3pm one day prior to your surgery. They will inform you of the time to arrive at Same Day Surgery which is located on the second floor of the [facility] Medical Mall.  You will receive a call from the Pre-Admission Testing office regarding your appointment for the anesthesia nurse's call to give you instructions for your surgery.The Pre-Admission Testing office is located at [address], on the first floor of the Medical Arts Center at [facility] in [address] (office is to the right as you enter through the [facility]). Please have all medications you are currently taking and your insurance card available.   Patient was advised to have nothing to eat or drink after midnight the night prior to surgery except that he may have only water until 2 hours before surgery with nothing to drink within 2 hours of surgery.  The patient states he currently takes no medications including blood thinners. Patient's questions were answered and he expressed understanding of these instructions.

## 2018-10-31 ENCOUNTER — Encounter
Admission: RE | Admit: 2018-10-31 | Discharge: 2018-10-31 | Disposition: A | Discharge status: Home / Self Care | Payer: BLUE CROSS/BLUE SHIELD | Source: Ambulatory Visit | Attending: Urology | Admitting: Urology

## 2018-10-31 ENCOUNTER — Encounter: Payer: Self-pay | Admitting: *Deleted

## 2018-10-31 ENCOUNTER — Other Ambulatory Visit: Payer: Self-pay

## 2018-10-31 NOTE — Patient Instructions (Signed)
Your procedure is scheduled on: 11/05/18 Tues Report to Same Day Surgery 2nd floor medical mall [facility](Medical Mall Entrance-take elevator on left to 2nd floor.  Check in with surgery information desk.) To find out your arrival time please call [phone] between 1PM - 3PM on 11/04/18 mon  Remember: Instructions that are not followed completely may result in serious medical risk, up to and including death, or upon the discretion of your surgeon and anesthesiologist your surgery may need to be rescheduled.    _x___ 1. Do not eat food after midnight the night before your procedure. You may drink clear liquids up to 2 hours before you are scheduled to arrive at the hospital for your procedure.  Do not drink clear liquids within 2 hours of your scheduled arrival to the hospital.  Clear liquids include  --Water or Apple juice without pulp  --Clear carbohydrate beverage such as ClearFast or Gatorade  --Black Coffee or Clear Tea (No milk, no creamers, do not add anything to                  the coffee or Tea Type 1 and type 2 diabetics should only drink water.   ____Ensure clear carbohydrate drink on the way to the hospital for bariatric patients  ____Ensure clear carbohydrate drink 3 hours before surgery for Dr Rutherford NailByrnett's patients if physician instructed.   No gum chewing or hard candies.     __x__ 2. No Alcohol for 24 hours before or after surgery.   __x__3. No Smoking or e-cigarettes for 24 prior to surgery.  Do not use any chewable tobacco products for at least 6 hour prior to surgery   ____  4. Bring all medications with you on the day of surgery if instructed.    __x__ 5. Notify your doctor if there is any change in your medical condition     (cold, fever, infections).    x___6. On the morning of surgery brush your teeth with toothpaste and water.  You may rinse your mouth with mouth wash if you wish.  Do not swallow any toothpaste or mouthwash.   Do not wear jewelry, make-up, hairpins,  clips or nail polish.  Do not wear lotions, powders, or perfumes. You may wear deodorant.  Do not shave 48 hours prior to surgery. Men may shave face and neck.  Do not bring valuables to the hospital.    [facility] is not responsible for any belongings or valuables.               Contacts, dentures or bridgework may not be worn into surgery.  Leave your suitcase in the car. After surgery it may be brought to your room.  For patients admitted to the hospital, discharge time is determined by your                       treatment team.  _  Patients discharged the day of surgery will not be allowed to drive home.  You will need someone to drive you home and stay with you the night of your procedure.    Please read over the following fact sheets that you were given:   [facility] Preparing for Surgery and or MRSA Information   _x___ Take anti-hypertensive listed below, cardiac, seizure, asthma,     anti-reflux and psychiatric medicines. These include:  1. None  2.  3.  4.  5.  6.  ____Fleets enema or Magnesium Citrate as directed.  ____ Use CHG Soap or sage wipes as directed on instruction sheet   ____ Use inhalers on the day of surgery and bring to hospital day of surgery  ____ Stop Metformin and Janumet 2 days prior to surgery.    ____ Take 1/2 of usual insulin dose the night before surgery and none on the morning     surgery.   _x___ Follow recommendations from Cardiologist, Pulmonologist or PCP regarding          stopping Aspirin, Coumadin, Plavix ,Eliquis, Effient, or Pradaxa, and Pletal.  X____Stop Anti-inflammatories such as Advil, Aleve, Ibuprofen, Motrin, Naproxen, Naprosyn, Goodies powders or aspirin products. OK to take Tylenol and                          Celebrex.   _x___ Stop supplements until after surgery.  But may continue Vitamin D, Vitamin B,       and multivitamin.   ____ Bring C-Pap to the hospital.

## 2018-10-31 NOTE — Patient Instructions (Signed)
Your procedure is scheduled on:  Report to Same Day Surgery 2nd floor medical mall [facility](Medical Mall Entrance-take elevator on left to 2nd floor.  Check in with surgery information desk.) To find out your arrival time please call [phone] between 1PM - 3PM on   Remember: Instructions that are not followed completely may result in serious medical risk, up to and including death, or upon the discretion of your surgeon and anesthesiologist your surgery may need to be rescheduled.    _x___ 1. Do not eat food after midnight the night before your procedure. You may drink clear liquids up to 2 hours before you are scheduled to arrive at the hospital for your procedure.  Do not drink clear liquids within 2 hours of your scheduled arrival to the hospital.  Clear liquids include  --Water or Apple juice without pulp  --Clear carbohydrate beverage such as ClearFast or Gatorade  --Black Coffee or Clear Tea (No milk, no creamers, do not add anything to                  the coffee or Tea Type 1 and type 2 diabetics should only drink water.   ____Ensure clear carbohydrate drink on the way to the hospital for bariatric patients  ____Ensure clear carbohydrate drink 3 hours before surgery for Dr Rutherford NailByrnett's patients if physician instructed.   No gum chewing or hard candies.     __x__ 2. No Alcohol for 24 hours before or after surgery.   __x__3. No Smoking or e-cigarettes for 24 prior to surgery.  Do not use any chewable tobacco products for at least 6 hour prior to surgery   ____  4. Bring all medications with you on the day of surgery if instructed.    __x__ 5. Notify your doctor if there is any change in your medical condition     (cold, fever, infections).    x___6. On the morning of surgery brush your teeth with toothpaste and water.  You may rinse your mouth with mouth wash if you wish.  Do not swallow any toothpaste or mouthwash.   Do not wear jewelry, make-up, hairpins, clips or nail  polish.  Do not wear lotions, powders, or perfumes. You may wear deodorant.  Do not shave 48 hours prior to surgery. Men may shave face and neck.  Do not bring valuables to the hospital.    [facility] is not responsible for any belongings or valuables.               Contacts, dentures or bridgework may not be worn into surgery.  Leave your suitcase in the car. After surgery it may be brought to your room.  For patients admitted to the hospital, discharge time is determined by your                       treatment team.  _  Patients discharged the day of surgery will not be allowed to drive home.  You will need someone to drive you home and stay with you the night of your procedure.    Please read over the following fact sheets that you were given:   [facility] Preparing for Surgery and or MRSA Information   _x___ Take anti-hypertensive listed below, cardiac, seizure, asthma,     anti-reflux and psychiatric medicines. These include:  1.   2.  3.  4.  5.  6.  ____Fleets enema or Magnesium Citrate as directed.  _x___ Use CHG Soap or sage wipes as directed on instruction sheet   ____ Use inhalers on the day of surgery and bring to hospital day of surgery  ____ Stop Metformin and Janumet 2 days prior to surgery.    ____ Take 1/2 of usual insulin dose the night before surgery and none on the morning     surgery.   _x___ Follow recommendations from Cardiologist, Pulmonologist or PCP regarding          stopping Aspirin, Coumadin, Plavix ,Eliquis, Effient, or Pradaxa, and Pletal.  X____Stop Anti-inflammatories such as Advil, Aleve, Ibuprofen, Motrin, Naproxen, Naprosyn, Goodies powders or aspirin products. OK to take Tylenol and                          Celebrex.   _x___ Stop supplements until after surgery.  But may continue Vitamin D, Vitamin B,       and multivitamin.   ____ Bring C-Pap to the hospital.    

## 2018-11-04 MED ORDER — CEFAZOLIN SODIUM-DEXTROSE 2-4 GM/100ML-% IV SOLN
2.0000 g | INTRAVENOUS | Status: AC
  Administered 2018-11-05: 2 g via INTRAVENOUS

## 2018-11-05 ENCOUNTER — Ambulatory Visit: Payer: BLUE CROSS/BLUE SHIELD | Admitting: Certified Registered Nurse Anesthetist

## 2018-11-05 ENCOUNTER — Encounter
Admission: RE | Disposition: A | Discharge status: Home / Self Care | Payer: Self-pay | Source: Ambulatory Visit | Attending: Urology

## 2018-11-05 ENCOUNTER — Other Ambulatory Visit: Payer: Self-pay

## 2018-11-05 ENCOUNTER — Ambulatory Visit
Admission: RE | Admit: 2018-11-05 | Discharge: 2018-11-05 | Disposition: A | Discharge status: Home / Self Care | Payer: BLUE CROSS/BLUE SHIELD | Source: Ambulatory Visit | Attending: Urology | Admitting: Urology

## 2018-11-05 DIAGNOSIS — N35912 Unspecified bulbous urethral stricture, male: Secondary | ICD-10-CM | POA: Diagnosis not present

## 2018-11-05 DIAGNOSIS — N399 Disorder of urinary system, unspecified: Secondary | ICD-10-CM

## 2018-11-05 HISTORY — DX: Acute pancreatitis without necrosis or infection, unspecified: K85.90

## 2018-11-05 HISTORY — DX: Other specified abnormal findings of blood chemistry: R79.89

## 2018-11-05 HISTORY — PX: CYSTOSCOPY WITH DIRECT VISION INTERNAL URETHROTOMY: SHX6637

## 2018-11-05 HISTORY — DX: Hematuria, unspecified: R31.9

## 2018-11-05 HISTORY — DX: Bladder-neck obstruction: N32.0

## 2018-11-05 SURGERY — CYSTOSCOPY, WITH DIRECT VISION INTERNAL URETHROTOMY
Anesthesia: General | Site: Urethra

## 2018-11-05 MED ORDER — ONDANSETRON HCL 4 MG/2ML IJ SOLN
INTRAMUSCULAR | Status: AC
  Filled 2018-11-05: qty 2

## 2018-11-05 MED ORDER — MIDAZOLAM HCL 2 MG/2ML IJ SOLN
INTRAMUSCULAR | Status: AC
  Filled 2018-11-05: qty 2

## 2018-11-05 MED ORDER — FENTANYL CITRATE (PF) 100 MCG/2ML IJ SOLN
INTRAMUSCULAR | Status: AC
  Administered 2018-11-05: 25 ug via INTRAVENOUS
  Filled 2018-11-05: qty 2

## 2018-11-05 MED ORDER — PROPOFOL 10 MG/ML IV BOLUS
INTRAVENOUS | Status: AC
  Filled 2018-11-05: qty 20

## 2018-11-05 MED ORDER — FAMOTIDINE 20 MG PO TABS
20.0000 mg | ORAL_TABLET | Freq: Once | ORAL | Status: AC
  Administered 2018-11-05: 20 mg via ORAL

## 2018-11-05 MED ORDER — HYDROCODONE-ACETAMINOPHEN 5-325 MG PO TABS
ORAL_TABLET | ORAL | Status: AC
  Administered 2018-11-05: 1 via ORAL
  Filled 2018-11-05: qty 1

## 2018-11-05 MED ORDER — ONDANSETRON HCL 4 MG/2ML IJ SOLN
INTRAMUSCULAR | Status: DC | PRN
  Administered 2018-11-05: 4 mg via INTRAVENOUS

## 2018-11-05 MED ORDER — MIDAZOLAM HCL 2 MG/2ML IJ SOLN
INTRAMUSCULAR | Status: DC | PRN
  Administered 2018-11-05: 2 mg via INTRAVENOUS

## 2018-11-05 MED ORDER — LACTATED RINGERS IV SOLN
INTRAVENOUS | Status: DC
  Administered 2018-11-05: 10:00:00 via INTRAVENOUS

## 2018-11-05 MED ORDER — LIDOCAINE HCL (PF) 2 % IJ SOLN
INTRAMUSCULAR | Status: AC
  Filled 2018-11-05: qty 10

## 2018-11-05 MED ORDER — FENTANYL CITRATE (PF) 100 MCG/2ML IJ SOLN
25.0000 ug | INTRAMUSCULAR | Status: AC | PRN
  Administered 2018-11-05 (×6): 25 ug via INTRAVENOUS

## 2018-11-05 MED ORDER — HYDROCODONE-ACETAMINOPHEN 5-325 MG PO TABS: 1.0000 | ORAL_TABLET | ORAL | 0 refills | Status: AC | PRN

## 2018-11-05 MED ORDER — FENTANYL CITRATE (PF) 100 MCG/2ML IJ SOLN
INTRAMUSCULAR | Status: AC
  Filled 2018-11-05: qty 2

## 2018-11-05 MED ORDER — DEXAMETHASONE SODIUM PHOSPHATE 10 MG/ML IJ SOLN
INTRAMUSCULAR | Status: DC | PRN
  Administered 2018-11-05: 10 mg via INTRAVENOUS

## 2018-11-05 MED ORDER — DEXAMETHASONE SODIUM PHOSPHATE 10 MG/ML IJ SOLN
INTRAMUSCULAR | Status: AC
  Filled 2018-11-05: qty 1

## 2018-11-05 MED ORDER — HYDROCODONE-ACETAMINOPHEN 5-325 MG PO TABS
1.0000 | ORAL_TABLET | Freq: Once | ORAL | Status: AC
  Administered 2018-11-05: 1 via ORAL

## 2018-11-05 MED ORDER — CEFAZOLIN SODIUM-DEXTROSE 2-4 GM/100ML-% IV SOLN
INTRAVENOUS | Status: AC
  Filled 2018-11-05: qty 100

## 2018-11-05 MED ORDER — FAMOTIDINE 20 MG PO TABS
ORAL_TABLET | ORAL | Status: AC
  Administered 2018-11-05: 20 mg via ORAL
  Filled 2018-11-05: qty 1

## 2018-11-05 MED ORDER — PROPOFOL 10 MG/ML IV BOLUS
INTRAVENOUS | Status: DC | PRN
  Administered 2018-11-05: 200 mg via INTRAVENOUS

## 2018-11-05 MED ORDER — LIDOCAINE HCL (CARDIAC) PF 100 MG/5ML IV SOSY
PREFILLED_SYRINGE | INTRAVENOUS | Status: DC | PRN
  Administered 2018-11-05: 100 mg via INTRAVENOUS

## 2018-11-05 MED ORDER — FENTANYL CITRATE (PF) 100 MCG/2ML IJ SOLN
INTRAMUSCULAR | Status: DC | PRN
  Administered 2018-11-05 (×4): 25 ug via INTRAVENOUS

## 2018-11-05 SURGICAL SUPPLY — 22 items
BAG DRAIN CYSTO-URO LG1000N (MISCELLANEOUS) ×3 IMPLANT
BAG URINE DRAINAGE (UROLOGICAL SUPPLIES) ×3 IMPLANT
BASIN GRAD PLASTIC 32OZ STRL (MISCELLANEOUS) IMPLANT
CATH FOL 2WAY LX 18X5 (CATHETERS) ×3 IMPLANT
CATH SET URETHRAL DILATOR (CATHETERS) IMPLANT
CONRAY 43 FOR UROLOGY 50M (MISCELLANEOUS) ×3 IMPLANT
COVER WAND RF STERILE (DRAPES) IMPLANT
DRAPE SHEET LG 3/4 BI-LAMINATE (DRAPES) IMPLANT
ELECT REM PT RETURN 9FT ADLT (ELECTROSURGICAL)
ELECTRODE REM PT RTRN 9FT ADLT (ELECTROSURGICAL) IMPLANT
GLOVE BIOGEL M 8.0 STRL (GLOVE) ×3 IMPLANT
GOWN STANDARD XL  REUSABL (MISCELLANEOUS) ×3 IMPLANT
KIT TURNOVER CYSTO (KITS) ×3 IMPLANT
PACK CYSTO AR (MISCELLANEOUS) ×3 IMPLANT
SENSORWIRE 0.038 NOT ANGLED (WIRE) ×3
SET CYSTO W/LG BORE CLAMP LF (SET/KITS/TRAYS/PACK) ×3 IMPLANT
SOL PREP PVP 2OZ (MISCELLANEOUS)
SOLUTION PREP PVP 2OZ (MISCELLANEOUS) IMPLANT
SURGILUBE 2OZ TUBE FLIPTOP (MISCELLANEOUS) ×3 IMPLANT
SYRINGE IRR TOOMEY STRL 70CC (SYRINGE) IMPLANT
WATER STERILE IRR 3000ML UROMA (IV SOLUTION) ×3 IMPLANT
WIRE SENSOR 0.038 NOT ANGLED (WIRE) ×1 IMPLANT

## 2018-11-05 NOTE — Anesthesia Procedure Notes (Signed)
Procedure Name: LMA Insertion Date/Time: 11/05/2018 10:57 AM Performed by: Dava NajjarFrazier, Tyson Parkison, CRNA Pre-anesthesia Checklist: Patient identified, Emergency Drugs available, Suction available and Patient being monitored Patient Re-evaluated:Patient Re-evaluated prior to induction Oxygen Delivery Method: Circle system utilized Preoxygenation: Pre-oxygenation with 100% oxygen Induction Type: IV induction LMA: LMA inserted LMA Size: 5.0 Number of attempts: 1 Placement Confirmation: positive ETCO2 and breath sounds checked- equal and bilateral Tube secured with: Tape Dental Injury: Teeth and Oropharynx as per pre-operative assessment

## 2018-11-05 NOTE — Discharge Instructions (Signed)

## 2018-11-05 NOTE — OR Nursing (Signed)
Foley to leg bag for discharge, bedside bag given for home use.  Foley instructions given for changing/emptying bags.  Instructions given for foley removal in am (syringe given).  Advises he will call office in am if he decides he would rather them take the foley outl

## 2018-11-05 NOTE — Interval H&P Note (Signed)
History and Physical Interval Note:  11/05/2018 10:27 AM  Ryan Alvarado  has presented today for surgery, with the diagnosis of bulbar urethral stricture  The various methods of treatment have been discussed with the patient and family. After consideration of risks, benefits and other options for treatment, the patient has consented to  Procedure(s): CYSTOSCOPY WITH DIRECT VISION INTERNAL URETHROTOMY (N/A) as a surgical intervention .  The patient's history has been reviewed, patient examined, no change in status, stable for surgery.  I have reviewed the patient's chart and labs.  Questions were answered to the patient's satisfaction.     Theador Jezewski C Aira Sallade

## 2018-11-05 NOTE — Anesthesia Postprocedure Evaluation (Signed)
Anesthesia Post Note  Patient: Philomena Courseraig A Holtrop  Procedure(s) Performed: CYSTOSCOPY WITH DIRECT VISION INTERNAL URETHROTOMY (N/A Urethra)  Patient location during evaluation: PACU Anesthesia Type: General Level of consciousness: awake and alert Pain management: pain level controlled Vital Signs Assessment: post-procedure vital signs reviewed and stable Respiratory status: spontaneous breathing, nonlabored ventilation, respiratory function stable and patient connected to nasal cannula oxygen Cardiovascular status: blood pressure returned to baseline and stable Postop Assessment: no apparent nausea or vomiting Anesthetic complications: no     Last Vitals:  Vitals:   11/05/18 1213 11/05/18 1305  BP: 124/74 129/75  Pulse: (!) 51 66  Resp: 14 14  Temp:    SpO2: 98% 98%    Last Pain:  Vitals:   11/05/18 1305  TempSrc:   PainSc: 0-No pain                 Jovita GammaKathryn L Fitzgerald

## 2018-11-05 NOTE — Anesthesia Preprocedure Evaluation (Addendum)
Anesthesia Evaluation  Patient identified by MRN, date of birth, ID band Patient awake    Reviewed: Allergy & Precautions, H&P , NPO status , Patient's Chart, lab work & pertinent test results  Airway Mallampati: I       Dental  (+) Teeth Intact   Pulmonary neg pulmonary ROS, neg COPD,           Cardiovascular (-) angina(-) Past MI and (-) Cardiac Stents negative cardio ROS  (-) dysrhythmias      Neuro/Psych negative neurological ROS  negative psych ROS   GI/Hepatic negative GI ROS, Neg liver ROS,   Endo/Other  negative endocrine ROS  Renal/GU      Musculoskeletal   Abdominal   Peds  Hematology negative hematology ROS (+)   Anesthesia Other Findings Past Medical History: No date: Bladder outlet obstruction No date: Blood in urine 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 No date: Pancreatitis  Past Surgical History: No date: FEMUR FRACTURE SURGERY; Left No date: FRACTURE SURGERY No date: NO PAST SURGERIES  BMI    Body Mass Index:  34.44 kg/m      Reproductive/Obstetrics negative OB ROS                            Anesthesia Physical Anesthesia Plan  ASA: II  Anesthesia Plan: General LMA   Post-op Pain Management:    Induction:   PONV Risk Score and Plan: Dexamethasone, Ondansetron, Midazolam and Treatment may vary due to age or medical condition  Airway Management Planned:   Additional Equipment:   Intra-op Plan:   Post-operative Plan:   Informed Consent: I have reviewed the patients History and Physical, chart, labs and discussed the procedure including the risks, benefits and alternatives for the proposed anesthesia with the patient or authorized representative who has indicated his/her understanding and acceptance.   Dental Advisory Given  Plan Discussed with: Anesthesiologist, CRNA and Surgeon  Anesthesia Plan Comments:         Anesthesia Quick Evaluation

## 2018-11-05 NOTE — Transfer of Care (Signed)
Immediate Anesthesia Transfer of Care Note  Patient: Ryan Alvarado  Procedure(s) Performed: CYSTOSCOPY WITH DIRECT VISION INTERNAL URETHROTOMY (N/A Urethra)  Patient Location: PACU  Anesthesia Type:General  Level of Consciousness: awake, alert , oriented and patient cooperative  Airway & Oxygen Therapy: Patient Spontanous Breathing  Post-op Assessment: Report given to RN and Post -op Vital signs reviewed and stable  Post vital signs: Reviewed and stable  Last Vitals:  Vitals Value Taken Time  BP 114/79 11/05/2018 11:29 AM  Temp 36 C 11/05/2018 11:29 AM  Pulse 63 11/05/2018 11:30 AM  Resp 12 11/05/2018 11:30 AM  SpO2 100 % 11/05/2018 11:30 AM  Vitals shown include unvalidated device data.  Last Pain:  Vitals:   11/05/18 0924  TempSrc: Temporal  PainSc: 0-No pain         Complications: No apparent anesthesia complications

## 2018-11-05 NOTE — Op Note (Signed)
Preoperative diagnosis:  1. Bulbar urethral stricture  Postoperative diagnosis:  1. Bulbar urethral stricture  Procedure: 1. Direct vision internal urethrotomy 2. Cystoscopy  Surgeon: Riki AltesScott C Makiyah Zentz, MD  Anesthesia: General  Complications: None  Intraoperative findings:  Approximately 6 French distal bulbar urethral stricture  EBL: Minimal  Specimens: None  Indication: Ryan Alvarado is a 42 y.o. patient with a long history of obstructive voiding symptoms and intermittent dysuria.  Office cystoscopy remarkable for a <10 French stricture in the bulbar urethra.  After reviewing the management options for treatment, he elected to proceed with the above surgical procedure(s). We have discussed the potential benefits and risks of the procedure, side effects of the proposed treatment, the likelihood of the patient achieving the goals of the procedure, and any potential problems that might occur during the procedure or recuperation. Informed consent has been obtained.  Description of procedure:  The patient was taken to the operating room and general anesthesia was induced.  The patient was placed in the dorsal lithotomy position, prepped and draped in the usual sterile fashion, and preoperative antibiotics were administered. A preoperative time-out was performed.   A 20 French optical urethrotome sheath with obturator was lubricated and passed per urethra.  The urethrotome a cold knife was then inserted into the sheath and the scope was advanced proximally.  In the bulbar urethra and approximately 6 French stricture was noted which appeared to be thin endoscopically.  A 0.038 sensor wire was placed through the urethrotome and through the stricture under direct vision and advanced into the bladder.  The stricture was then incised in 12 o'clock position with the cold knife without difficulty.  The urethrotome was removed.  A 21 French scope was passed per urethra.  The NSAI stricture was  located in the distal bulb.  The prostate was nonocclusive.  Panendoscopy was performed and the bladder mucosa showed no erythema, solid or papillary lesions.  The ureteral orifices were normal appearing bilaterally.  The cystoscope was withdrawn.  The inside stricture showed no bleeding.  The cystoscope was removed and an 518 French Foley catheter was placed without difficulty.  Plan: Foley catheter will remain indwelling for 24 hours.  Irineo AxonScott Davelyn Gwinn, MD  Riki AltesScott C Jeana Kersting, M.D.

## 2018-11-05 NOTE — Anesthesia Post-op Follow-up Note (Signed)
Anesthesia QCDR form completed.
